# Patient Record
Sex: Male | Born: 1990 | Race: Black or African American | Hispanic: No | Marital: Single
Health system: Southern US, Community
[De-identification: ages and names within clinical notes are randomized; demographics above are authoritative.]

---
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
## 2012-03-30 ENCOUNTER — Emergency Department (HOSPITAL_COMMUNITY): Payer: No Typology Code available for payment source

## 2012-03-30 ENCOUNTER — Encounter (HOSPITAL_COMMUNITY): Payer: Self-pay | Admitting: *Deleted

## 2012-03-30 ENCOUNTER — Emergency Department (HOSPITAL_COMMUNITY)
Admission: EM | Admit: 2012-03-30 | Discharge: 2012-03-30 | Disposition: A | Discharge status: Home / Self Care | Payer: No Typology Code available for payment source | Attending: Emergency Medicine | Admitting: Emergency Medicine

## 2012-03-30 DIAGNOSIS — T148XXA Other injury of unspecified body region, initial encounter: Secondary | ICD-10-CM | POA: Insufficient documentation

## 2012-03-30 DIAGNOSIS — IMO0002 Reserved for concepts with insufficient information to code with codable children: Secondary | ICD-10-CM | POA: Insufficient documentation

## 2012-03-30 DIAGNOSIS — S0003XA Contusion of scalp, initial encounter: Secondary | ICD-10-CM | POA: Insufficient documentation

## 2012-03-30 DIAGNOSIS — Y939 Activity, unspecified: Secondary | ICD-10-CM | POA: Insufficient documentation

## 2012-03-30 DIAGNOSIS — S8990XA Unspecified injury of unspecified lower leg, initial encounter: Secondary | ICD-10-CM | POA: Insufficient documentation

## 2012-03-30 DIAGNOSIS — S0993XA Unspecified injury of face, initial encounter: Secondary | ICD-10-CM | POA: Insufficient documentation

## 2012-03-30 DIAGNOSIS — S199XXA Unspecified injury of neck, initial encounter: Secondary | ICD-10-CM | POA: Insufficient documentation

## 2012-03-30 DIAGNOSIS — S0093XA Contusion of unspecified part of head, initial encounter: Secondary | ICD-10-CM

## 2012-03-30 DIAGNOSIS — Y92410 Unspecified street and highway as the place of occurrence of the external cause: Secondary | ICD-10-CM | POA: Insufficient documentation

## 2012-03-30 MED ORDER — IBUPROFEN 800 MG PO TABS: 800.0000 mg | ORAL_TABLET | Freq: Three times a day (TID) | ORAL | Status: AC | PRN

## 2012-03-30 MED ORDER — METHOCARBAMOL 500 MG PO TABS: 500.0000 mg | ORAL_TABLET | Freq: Two times a day (BID) | ORAL | Status: AC

## 2012-03-30 MED ORDER — IBUPROFEN 800 MG PO TABS
800.0000 mg | ORAL_TABLET | Freq: Once | ORAL | Status: AC
  Administered 2012-03-30: 800 mg via ORAL
  Filled 2012-03-30: qty 1
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
## 2012-03-31 NOTE — ED Provider Notes (Signed)
Medical screening examination/treatment/procedure(s) were performed by non-physician practitioner and as supervising physician I was immediately available for consultation/collaboration.  Mady Oubre M Naraly Fritcher, MD 03/31/12 0828 
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
## 2015-02-19 ENCOUNTER — Emergency Department (HOSPITAL_COMMUNITY)
Admission: EM | Admit: 2015-02-19 | Discharge: 2015-02-19 | Disposition: A | Discharge status: Home / Self Care | Payer: BLUE CROSS/BLUE SHIELD | Source: Home / Self Care | Attending: Emergency Medicine | Admitting: Emergency Medicine

## 2015-02-19 ENCOUNTER — Encounter (HOSPITAL_COMMUNITY): Payer: Self-pay | Admitting: Emergency Medicine

## 2015-02-19 DIAGNOSIS — H6091 Unspecified otitis externa, right ear: Secondary | ICD-10-CM

## 2015-02-19 MED ORDER — NEOMYCIN-POLYMYXIN-HC 3.5-10000-1 OT SUSP: 4.0000 [drp] | Freq: Three times a day (TID) | OTIC | Status: AC

## 2015-02-19 MED ORDER — AMOXICILLIN 500 MG PO CAPS: 500.0000 mg | ORAL_CAPSULE | Freq: Two times a day (BID) | ORAL | Status: AC

## 2015-02-19 NOTE — ED Provider Notes (Signed)
CSN: [id number]     Arrival date & time 02/19/15  1311 History   First MD Initiated Contact with Patient 02/19/15 1439     Chief Complaint  Patient presents with  . Otalgia   (Consider location/radiation/quality/duration/timing/severity/associated sxs/prior Treatment) HPI  He is a 25 year old man here for evaluation of right ear pain. He states this started about 3 days ago. Initially it was associated with some fevers and sore throat. The fevers and sore throat have resolved. He reports drainage from the right ear. He also reports decreased hearing. No history of ear infections.  History reviewed. No pertinent past medical history. History reviewed. No pertinent past surgical history. History reviewed. No pertinent family history. Social History  Substance Use Topics  . Smoking status: None  . Smokeless tobacco: None  . Alcohol Use: No    Review of Systems As in history of present illness Allergies  Review of patient's allergies indicates no known allergies.  Home Medications   Prior to Admission medications   Medication Sig Start Date End Date Taking? Authorizing Provider  amoxicillin (AMOXIL) 500 MG capsule Take 1 capsule (500 mg total) by mouth 2 (two) times daily. 02/19/15   Charm Rings, MD  ibuprofen (ADVIL,MOTRIN) 800 MG tablet Take 1 tablet (800 mg total) by mouth every 8 (eight) hours as needed for pain. 03/30/12   Ivonne Andrew, PA-C  methocarbamol (ROBAXIN) 500 MG tablet Take 1 tablet (500 mg total) by mouth 2 (two) times daily. 03/30/12   Ivonne Andrew, PA-C  neomycin-polymyxin-hydrocortisone (CORTISPORIN) 3.5-10000-1 otic suspension Place 4 drops into the right ear 3 (three) times daily. For 7 days 02/19/15   Charm Rings, MD   Meds Ordered and Administered this Visit  Medications - No data to display  BP 92/77 mmHg  Pulse 62  Temp(Src) 98.8 F (37.1 C) (Oral)  SpO2 100% No data found.   Physical Exam  Constitutional: He is oriented to person, place, and time.  He appears well-developed and well-nourished. No distress.  HENT:  Left TM partially obscured by earwax, the visualized portions are normal. Right ear canal with purulent material. Unable to visualize TM.  Neck: Neck supple.  Cardiovascular: Normal rate.   Pulmonary/Chest: Effort normal.  Neurological: He is alert and oriented to person, place, and time.    ED Course  Procedures (including critical care time)  Labs Review Labs Reviewed - No data to display  Imaging Review No results found.    MDM   1. Right otitis externa    Right ear canal is erythematous and swollen after ear wash. Unable to get a good look at the tympanic membrane due to the swelling. We'll treat with amoxicillin and Cortisporin. Follow-up if not improving in the next 2 days.    Charm Rings, MD 02/19/15 1520

## 2015-02-19 NOTE — ED Notes (Signed)
The patient presented to the [facility] with a complaint of right sided otalgia x 3 days.

## 2015-02-19 NOTE — Discharge Instructions (Signed)
You have an infection of your right ear. Take amoxicillin twice a day for 10 days. Use the Cortisporin drops in the right ear 3 times a day for 7 days. You should see improvement in the next 2 days. Follow-up as needed.
